# Patient Record
Sex: Male | Born: 1995
Health system: Southern US, Community
[De-identification: ages and names within clinical notes are randomized; demographics above are authoritative.]

## PROBLEM LIST (undated history)

## (undated) HISTORY — PX: TOOTH EXTRACTION: SUR596

---

## 2015-04-10 ENCOUNTER — Emergency Department
Admission: EM | Admit: 2015-04-10 | Discharge: 2015-04-11 | Disposition: A | Discharge status: Home / Self Care | Payer: BLUE CROSS/BLUE SHIELD | Attending: Emergency Medicine | Admitting: Emergency Medicine

## 2015-04-10 ENCOUNTER — Encounter: Payer: Self-pay | Admitting: Emergency Medicine

## 2015-04-10 DIAGNOSIS — X58XXXA Exposure to other specified factors, initial encounter: Secondary | ICD-10-CM | POA: Insufficient documentation

## 2015-04-10 DIAGNOSIS — Y9389 Activity, other specified: Secondary | ICD-10-CM | POA: Insufficient documentation

## 2015-04-10 DIAGNOSIS — R42 Dizziness and giddiness: Secondary | ICD-10-CM | POA: Insufficient documentation

## 2015-04-10 DIAGNOSIS — S025XXA Fracture of tooth (traumatic), initial encounter for closed fracture: Secondary | ICD-10-CM | POA: Diagnosis not present

## 2015-04-10 DIAGNOSIS — Y998 Other external cause status: Secondary | ICD-10-CM | POA: Diagnosis not present

## 2015-04-10 DIAGNOSIS — H5509 Other forms of nystagmus: Secondary | ICD-10-CM | POA: Insufficient documentation

## 2015-04-10 DIAGNOSIS — Y92000 Kitchen of unspecified non-institutional (private) residence as  the place of occurrence of the external cause: Secondary | ICD-10-CM | POA: Diagnosis not present

## 2015-04-10 DIAGNOSIS — S0990XA Unspecified injury of head, initial encounter: Secondary | ICD-10-CM | POA: Diagnosis not present

## 2015-04-10 DIAGNOSIS — R55 Syncope and collapse: Secondary | ICD-10-CM

## 2015-04-10 DIAGNOSIS — S025XXB Fracture of tooth (traumatic), initial encounter for open fracture: Secondary | ICD-10-CM

## 2015-04-10 LAB — CBC WITH DIFFERENTIAL/PLATELET
BASOS ABS: 0 10*3/uL (ref 0–0.1)
Basophils Relative: 0 %
Eosinophils Absolute: 0.2 10*3/uL (ref 0–0.7)
Eosinophils Relative: 2 %
HEMATOCRIT: 43.3 % (ref 40.0–52.0)
HEMOGLOBIN: 14.3 g/dL (ref 13.0–18.0)
LYMPHS PCT: 33 %
Lymphs Abs: 3.1 10*3/uL (ref 1.0–3.6)
MCH: 29.9 pg (ref 26.0–34.0)
MCHC: 32.9 g/dL (ref 32.0–36.0)
MCV: 90.8 fL (ref 80.0–100.0)
Monocytes Absolute: 0.7 10*3/uL (ref 0.2–1.0)
Monocytes Relative: 8 %
NEUTROS ABS: 5.3 10*3/uL (ref 1.4–6.5)
NEUTROS PCT: 57 %
PLATELETS: 272 10*3/uL (ref 150–440)
RBC: 4.77 MIL/uL (ref 4.40–5.90)
RDW: 13 % (ref 11.5–14.5)
WBC: 9.4 10*3/uL (ref 3.8–10.6)

## 2015-04-10 LAB — BASIC METABOLIC PANEL
ANION GAP: 7 (ref 5–15)
BUN: 14 mg/dL (ref 6–20)
CHLORIDE: 103 mmol/L (ref 101–111)
CO2: 30 mmol/L (ref 22–32)
Calcium: 8.9 mg/dL (ref 8.9–10.3)
Creatinine, Ser: 0.85 mg/dL (ref 0.61–1.24)
GFR calc Af Amer: >60 mL/min (ref >60)
Glucose, Bld: 97 mg/dL (ref 65–99)
POTASSIUM: 3.4 mmol/L — AB (ref 3.5–5.1)
SODIUM: 140 mmol/L (ref 135–145)

## 2015-04-10 LAB — GLUCOSE, CAPILLARY: Glucose-Capillary: 87 mg/dL (ref 65–99)

## 2015-04-10 NOTE — ED Notes (Signed)
MD at bedside. 

## 2015-04-10 NOTE — ED Notes (Signed)
Pt presents to ED after he had a syncopal episode just prior to arrival while at home with his parents. Pt denies recent illness. Pt states he got up from a sitting position to get a drink of water because his "head was feeling tingly".  Pt states he "passed out" and fell on the floor in the kitchen. LOC lasted <1 minute; pt broke a top tooth during his fall. Pt c/o lightheadedness and tingling to his extremities at this time. Reports experiencing occasional dizziness several times the past year but has not been evaluated for the same. Denies any injury or pain at this time.

## 2015-04-10 NOTE — ED Notes (Signed)
Pt reports he no longer has tingling to his extremities. Pt is alert and calm. Talkative with his father at the bedside. Pt states just prior to his syncopal episode he did have a slight headache. Denies currently. Only pt reports having is from his mouth where he broke his tooth. Denies any new symptoms.

## 2015-04-11 ENCOUNTER — Emergency Department: Payer: BLUE CROSS/BLUE SHIELD

## 2015-04-11 LAB — TROPONIN I: Troponin I: <0.03 ng/mL (ref <0.031)

## 2015-04-11 MED ORDER — SODIUM CHLORIDE 0.9 % IV BOLUS (SEPSIS)
1000.0000 mL | Freq: Once | INTRAVENOUS | Status: AC
  Administered 2015-04-11: 1000 mL via INTRAVENOUS

## 2015-04-11 MED ORDER — OXYCODONE-ACETAMINOPHEN 5-325 MG PO TABS
1.0000 | ORAL_TABLET | Freq: Once | ORAL | Status: AC
Start: 2015-04-11 — End: 2015-04-11
  Administered 2015-04-11: 1 via ORAL
  Filled 2015-04-11: qty 1

## 2015-04-11 NOTE — Discharge Instructions (Signed)

## 2015-04-11 NOTE — ED Provider Notes (Signed)
[facility] Emergency Department Provider Note  ____________________________________________  Time seen: Approximately 0003 AM  I have reviewed the triage vital signs and the nursing notes.   HISTORY  Chief Complaint Loss of Consciousness    HPI Jason Brewer is a 19 y.o. male who comes into the hospital today after a syncopal episode. The patient reports that he got up to get a glass of water and he started to feel lightheaded. He reports a Ventolin further countertop and passed out. The patient's father reports that he was out for maybe 15 seconds because after he heard the fall the patient was waking up when he arrived in the kitchen. The patient denied any chest pain or shortness of breath prior to the father did have a slight headache. He reports that he had a cough 2 weeks ago but he had been doing well and eating and drinking like normal. According to the patient's that he had been more sleepy because he recently finished finals. The patient has had some episodes of dizziness in the past but has never passed out like this before.The patient has some 7 out of 10 pain in his mouth where he broke his tooth after the fall.   History reviewed. No pertinent past medical history.  There are no active problems to display for this patient.   Past Surgical History  Procedure Laterality Date  . Tooth extraction      No current outpatient prescriptions on file.  Allergies Review of patient's allergies indicates no known allergies.  No family history on file.  Social History Social History  Substance Use Topics  . Smoking status: Never Smoker   . Smokeless tobacco: None  . Alcohol Use: Yes    Review of Systems Constitutional: No fever/chills Eyes: No visual changes. ENT: tooth pain. Cardiovascular: Denies chest pain. Respiratory: Denies shortness of breath. Gastrointestinal: No abdominal pain.  No nausea, no vomiting.  No diarrhea.  No  constipation. Genitourinary: Negative for dysuria. Musculoskeletal: Negative for back pain. Skin: Negative for rash. Neurological: headache.  10-point ROS otherwise negative.  ____________________________________________   PHYSICAL EXAM:  VITAL SIGNS: ED Triage Vitals  Enc Vitals Group     BP 04/10/15 2258 114/57 mmHg     Pulse Rate 04/10/15 2258 65     Resp 04/10/15 2258 18     Temp 04/10/15 2258 98.6 F (37 C)     Temp Source 04/10/15 2258 Oral     SpO2 04/10/15 2258 98 %     Weight 04/10/15 2258 125 lb (56.7 kg)     Height 04/10/15 2258  (1.803 m)     Head Cir --      Peak Flow --      Pain Score 04/10/15 2305 0     Pain Loc --      Pain Edu? --      Excl. in GC? --     Constitutional: Alert and oriented. Well appearing and in no acute distress. Eyes: Conjunctivae are normal. PERRL. EOMI. Head: Atraumatic. Nose: No congestion/rhinnorhea. Mouth/Throat: Mucous membranes are moist.  Oropharynx non-erythematous. Cardiovascular: Normal rate, regular rhythm. Grossly normal heart sounds.  Good peripheral circulation. Respiratory: Normal respiratory effort.  No retractions. Lungs CTAB. Gastrointestinal: Soft and nontender. No distention. Positive bowel sounds Musculoskeletal: No lower extremity tenderness nor edema.  Neurologic:  Normal speech and language. Cranial nerves II through XII are grossly intact, patient does have some mild horizontal nystagmus Skin:  Skin is warm, dry and intact.  Psychiatric: Mood and affect are normal.   ____________________________________________   LABS (all labs ordered are listed, but only abnormal results are displayed)  Labs Reviewed  BASIC METABOLIC PANEL - Abnormal; Notable for the following:    Potassium 3.4 (*)    All other components within normal limits  CBC WITH DIFFERENTIAL/PLATELET  GLUCOSE, CAPILLARY  TROPONIN I  TROPONIN I  CBG MONITORING, ED   ____________________________________________  EKG  ED ECG  REPORT I, Rebecka ApleyWebster,  Anahi Belmar P, the attending physician, personally viewed and interpreted this ECG.   Date: 04/10/2015  EKG Time: 2304  Rate: 65  Rhythm: normal sinus rhythm  Axis: normal  Intervals:none  ST&T Change: none  ____________________________________________  RADIOLOGY  CT head: No acute intracranial pathology ____________________________________________   PROCEDURES  Procedure(s) performed: None  Critical Care performed: No  ____________________________________________   INITIAL IMPRESSION / ASSESSMENT AND PLAN / ED COURSE  Pertinent labs & imaging results that were available during my care of the patient were reviewed by me and considered in my medical decision making (see chart for details).  This is a 19 year old male who comes in today with a syncopal event. The patient had no symptoms that preceded the syncopal event. The patient's orthostatics were unremarkable and he did not feel dizzy when he was standing. The patient did have blood work that was unremarkable as well as CT that did not show any cause for syncope. The patient is sitting up well and he reports that he has no complaints at this time. I feel it is appropriate to discharge the patient home and have her follow up with his primary care physician for further evaluation of his syncope. ____________________________________________   FINAL CLINICAL IMPRESSION(S) / ED DIAGNOSES  Final diagnoses:  Syncope, unspecified syncope type  Traumatic fracture of tooth, open, initial encounter      Rebecka ApleyAllison P Lior Hoen, MD 04/11/15 0410

## 2017-01-28 DIAGNOSIS — F419 Anxiety disorder, unspecified: Secondary | ICD-10-CM | POA: Diagnosis not present

## 2017-05-14 IMAGING — CT CT HEAD W/O CM
2 series · 14 of 30 positions shown, 16 images · non-contrast
Comparison: None.

CLINICAL DATA: 19-year-old male with syncope and fall

EXAM:
CT HEAD WITHOUT CONTRAST
TECHNIQUE: Contiguous axial images were obtained from the base of the skull
through the vertex without intravenous contrast.

[Series 2: head wo · axial · 0.47mm/px · z∈[-154,-54]mm · 6 of 30 slices shown, 8 images]
[im 5/30  brain]
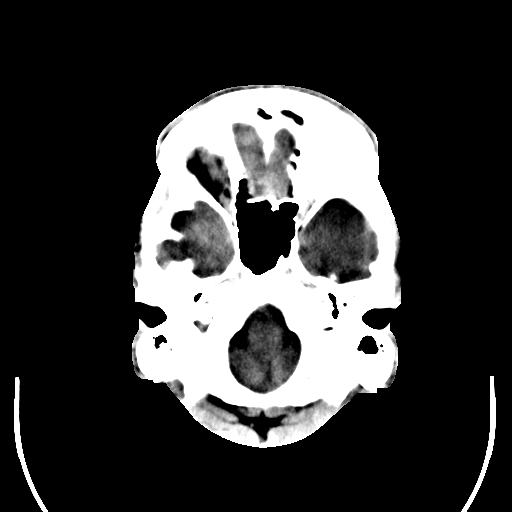
[im 5/30  bone]
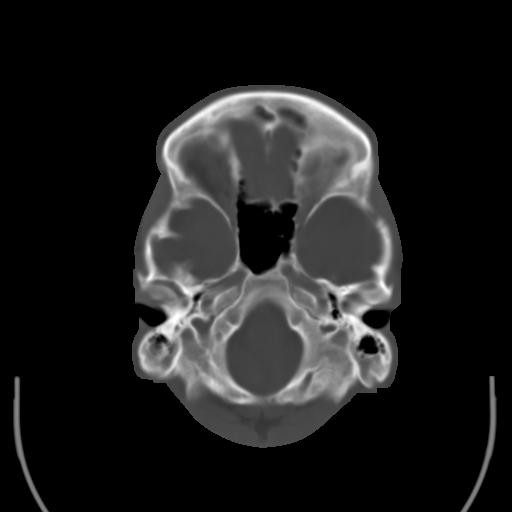
[im 9/30  brain]
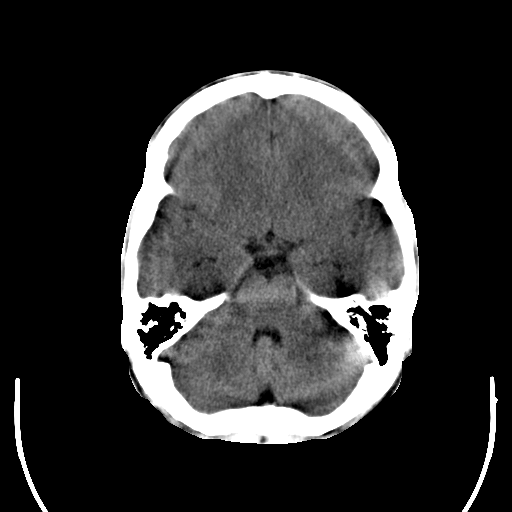
[im 13/30  brain]
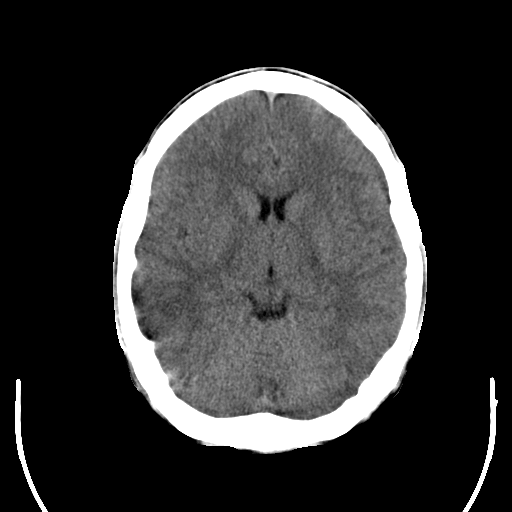
[im 17/30  brain]
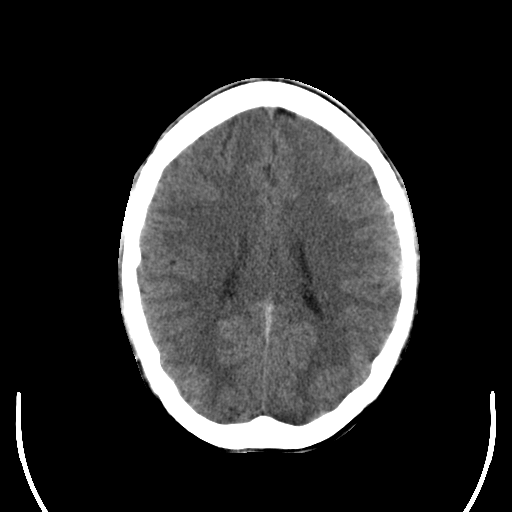
[im 21/30  brain]
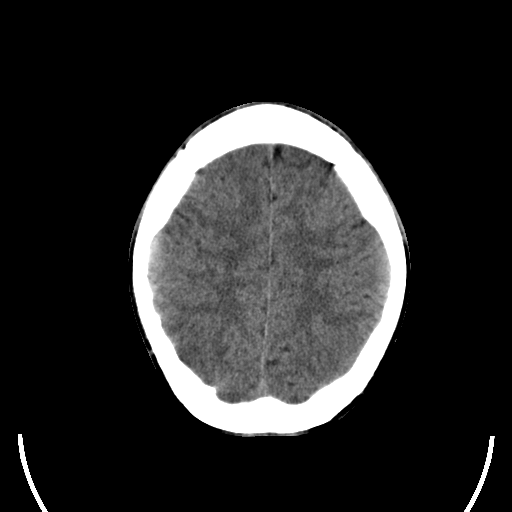
[im 21/30  bone]
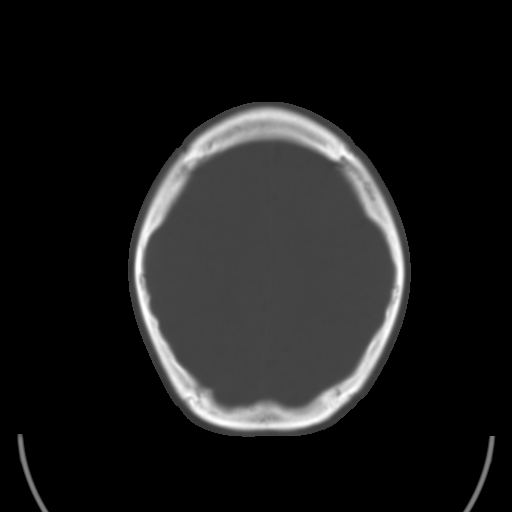
[im 25/30  brain]
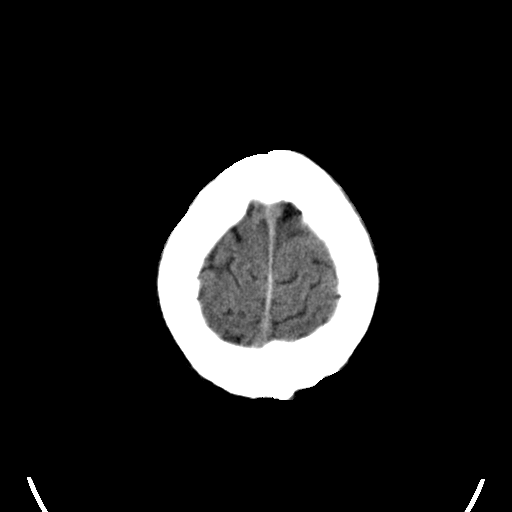

[Series 3: head bone · axial · 0.47mm/px · z∈[-171,-35]mm · 8 of 84 slices shown]
[im 8/84  bone]
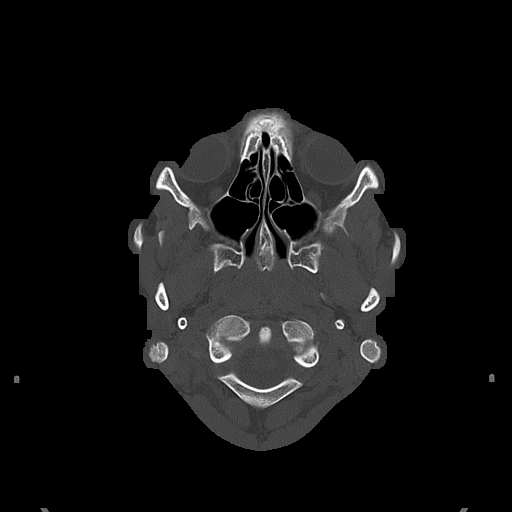
[im 16/84  bone]
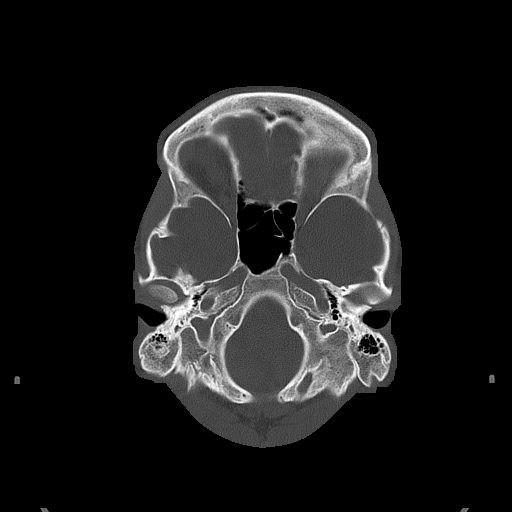
[im 28/84  bone]
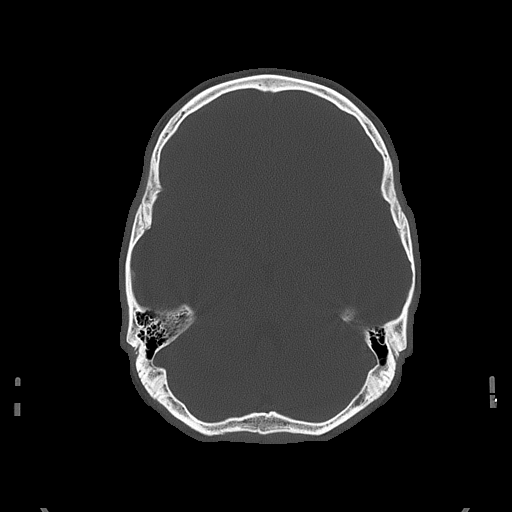
[im 36/84  bone]
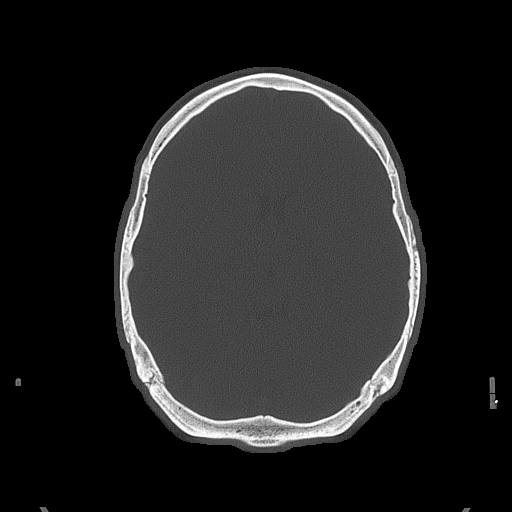
[im 48/84  bone]
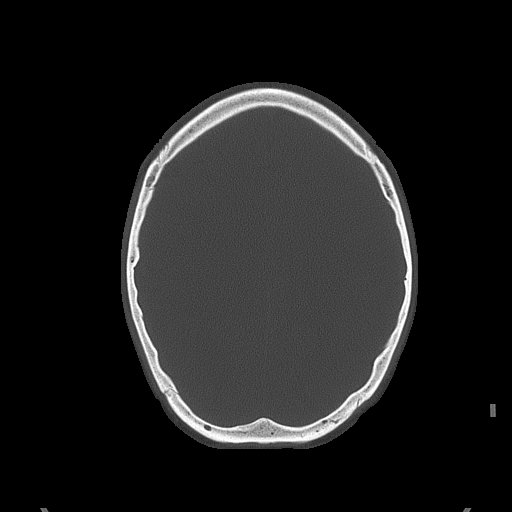
[im 56/84  bone]
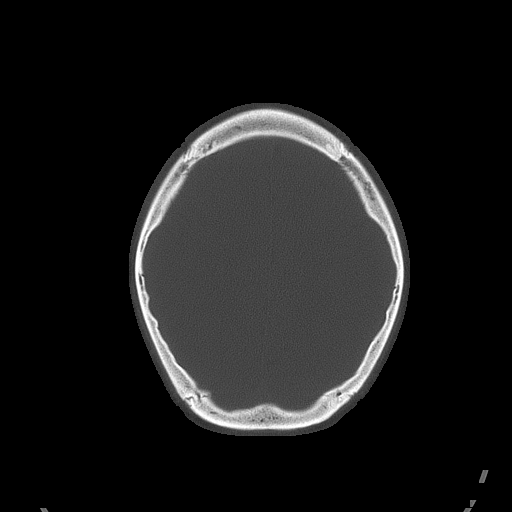
[im 68/84  bone]
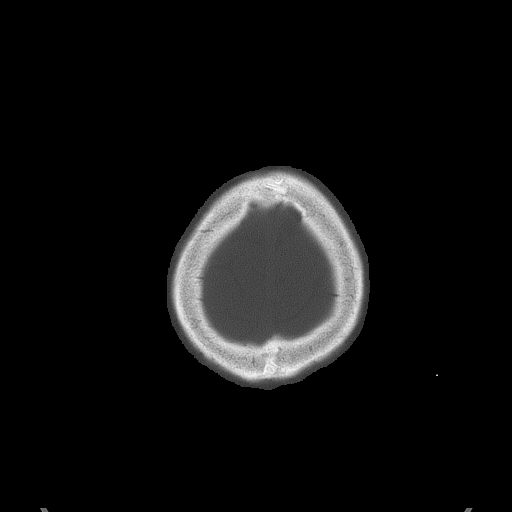
[im 76/84  bone]
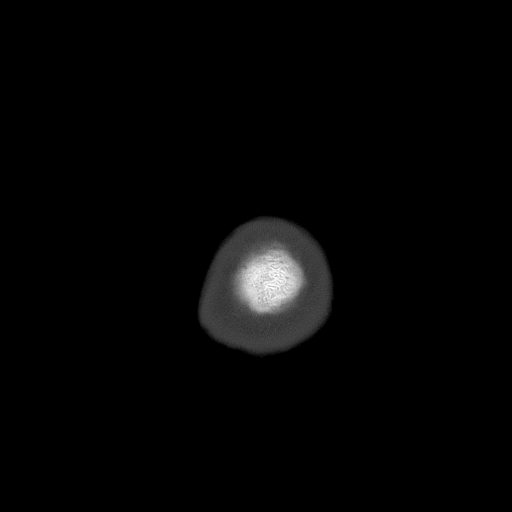

[14 of 30 positions shown; findings below may reference images not displayed]

FINDINGS: The ventricles and the sulci are appropriate in size for the
patient's age. There is no intracranial hemorrhage. No midline shift
or mass effect identified. The gray-white matter differentiation is
preserved.

The visualized paranasal sinuses and mastoid air cells are well
aerated. The calvarium is intact.
IMPRESSION: No acute intracranial pathology.

## 2018-01-27 DIAGNOSIS — F419 Anxiety disorder, unspecified: Secondary | ICD-10-CM | POA: Diagnosis not present

## 2018-01-27 DIAGNOSIS — F909 Attention-deficit hyperactivity disorder, unspecified type: Secondary | ICD-10-CM | POA: Diagnosis not present

## 2018-02-17 DIAGNOSIS — F909 Attention-deficit hyperactivity disorder, unspecified type: Secondary | ICD-10-CM | POA: Diagnosis not present

## 2018-02-17 DIAGNOSIS — F419 Anxiety disorder, unspecified: Secondary | ICD-10-CM | POA: Diagnosis not present

## 2018-03-05 DIAGNOSIS — F419 Anxiety disorder, unspecified: Secondary | ICD-10-CM | POA: Diagnosis not present

## 2018-03-05 DIAGNOSIS — F909 Attention-deficit hyperactivity disorder, unspecified type: Secondary | ICD-10-CM | POA: Diagnosis not present

## 2018-03-24 ENCOUNTER — Encounter: Payer: Self-pay | Admitting: Internal Medicine

## 2018-03-24 ENCOUNTER — Ambulatory Visit (INDEPENDENT_AMBULATORY_CARE_PROVIDER_SITE_OTHER): Payer: BLUE CROSS/BLUE SHIELD | Admitting: Internal Medicine

## 2018-03-24 VITALS — BP 106/68 | HR 76 | Temp 98.0°F | Wt 122.0 lb

## 2018-03-24 DIAGNOSIS — F902 Attention-deficit hyperactivity disorder, combined type: Secondary | ICD-10-CM | POA: Insufficient documentation

## 2018-03-24 DIAGNOSIS — F411 Generalized anxiety disorder: Secondary | ICD-10-CM | POA: Insufficient documentation

## 2018-03-24 MED ORDER — AMPHETAMINE-DEXTROAMPHET ER 10 MG PO CP24: 10.0000 mg | ORAL_CAPSULE | Freq: Every day | ORAL | 0 refills | Status: DC

## 2018-03-24 NOTE — Assessment & Plan Note (Signed)
Stable off meds He is not interested in starting medication for anxiety at this time Will monitor

## 2018-03-24 NOTE — Patient Instructions (Signed)

## 2018-03-24 NOTE — Assessment & Plan Note (Signed)
Psychologist note reviewed Will start Adderall XR 10 mg daily Advised him to update me in 3-4 weeks and let me know how he is doing

## 2018-03-24 NOTE — Progress Notes (Signed)
HPI  Jason Brewer presents to the clinic today for her annual exam. He is transferring care from [facility].  GAD: Triggered by stress at school. Took medication for a short period of time but did not feel like it was effective. Still feels anxious at times but denies panic attacks, depression, SI/HI.  ADHD: He has trouble with focus and motivation. He recently saw a psychologist that diagnosed him with ADHD. He is here to initiate medication management.  Flu: never Tetanus: 2009 Dentist: as needed  No past medical history on file.  No current outpatient medications on file.   No current facility-administered medications for this visit.     Allergies  Allergen Reactions  . Bee Venom Swelling    No family history on file.  Social History   Socioeconomic History  . Marital status: Single    Spouse name: Not on file  . Number of children: Not on file  . Years of education: Not on file  . Highest education level: Not on file  Occupational History  . Not on file  Social Needs  . Financial resource strain: Not on file  . Food insecurity:    Worry: Not on file    Inability: Not on file  . Transportation needs:    Medical: Not on file    Non-medical: Not on file  Tobacco Use  . Smoking status: Never Smoker  . Smokeless tobacco: Never Used  Substance and Sexual Activity  . Alcohol use: Not Currently  . Drug use: No  . Sexual activity: Not on file  Lifestyle  . Physical activity:    Days per week: Not on file    Minutes per session: Not on file  . Stress: Not on file  Relationships  . Social connections:    Talks on phone: Not on file    Gets together: Not on file    Attends religious service: Not on file    Active member of club or organization: Not on file    Attends meetings of clubs or organizations: Not on file    Relationship status: Not on file  . Intimate partner violence:    Fear of current or ex partner: Not on file    Emotionally abused: Not on file     Physically abused: Not on file    Forced sexual activity: Not on file  Other Topics Concern  . Not on file  Social History Narrative  . Not on file    ROS:  Constitutional: Denies fever, malaise, fatigue, headache or abrupt weight changes.  HEENT: Denies eye pain, eye redness, ear pain, ringing in the ears, wax buildup, runny nose, nasal congestion, bloody nose, or sore throat. Respiratory: Denies difficulty breathing, shortness of breath, cough or sputum production.   Cardiovascular: Denies chest pain, chest tightness, palpitations or swelling in the hands or feet.  Gastrointestinal: Denies abdominal pain, bloating, constipation, diarrhea or blood in the stool.  GU: Denies frequency, urgency, pain with urination, blood in urine, odor or discharge. Musculoskeletal: Denies decrease in range of motion, difficulty with gait, muscle pain or joint pain and swelling.  Skin: Denies redness, rashes, lesions or ulcercations.  Neurological: Jason Brewer reports trouble focusing, trouble with motivation. Denies dizziness, difficulty with memory, difficulty with speech or problems with balance and coordination.  Psych: Jason Brewer reports anxiety. Denies depression, SI/HI.  No other specific complaints in a complete review of systems (except as listed in HPI above).  PE:  BP 106/68   Pulse 76  Temp 98 F (36.7 C) (Oral)   Wt 122 lb (55.3 kg)   SpO2 98%   BMI 17.02 kg/m   Wt Readings from Last 3 Encounters:  03/24/18 122 lb (55.3 kg)  04/10/15 125 lb (56.7 kg) (7 %, Z= -1.48)*   * Growth percentiles are based on CDC (Boys, 2-20 Years) data.    General: Appears his stated age, underweight in NAD. Skin: Dry and intact. Cardiovascular: Normal rate and rhythm. S1,S2 noted.  No murmur, rubs or gallops noted.  Pulmonary/Chest: Normal effort and positive vesicular breath sounds. No respiratory distress. No wheezes, rales or ronchi noted.  Neurological: Alert and oriented.  Psychiatric: Anxious appearing.  Judgment and thought content normal.    BMET    Component Value Date/Time   NA 140 04/10/2015 2309   K 3.4 (L) 04/10/2015 2309   CL 103 04/10/2015 2309   CO2 30 04/10/2015 2309   GLUCOSE 97 04/10/2015 2309   BUN 14 04/10/2015 2309   CREATININE 0.85 04/10/2015 2309   CALCIUM 8.9 04/10/2015 2309   GFRNONAA >60 04/10/2015 2309   GFRAA >60 04/10/2015 2309    Lipid Panel  No results found for: CHOL, TRIG, HDL, CHOLHDL, VLDL, LDLCALC  CBC    Component Value Date/Time   WBC 9.4 04/10/2015 2309   RBC 4.77 04/10/2015 2309   HGB 14.3 04/10/2015 2309   HCT 43.3 04/10/2015 2309   PLT 272 04/10/2015 2309   MCV 90.8 04/10/2015 2309   MCH 29.9 04/10/2015 2309   MCHC 32.9 04/10/2015 2309   RDW 13.0 04/10/2015 2309   LYMPHSABS 3.1 04/10/2015 2309   MONOABS 0.7 04/10/2015 2309   EOSABS 0.2 04/10/2015 2309   BASOSABS 0.0 04/10/2015 2309    Hgb A1C No results found for: HGBA1C   Assessment and Plan:

## 2018-03-27 ENCOUNTER — Encounter: Payer: Self-pay | Admitting: Internal Medicine

## 2018-04-02 DIAGNOSIS — F909 Attention-deficit hyperactivity disorder, unspecified type: Secondary | ICD-10-CM | POA: Diagnosis not present

## 2018-04-02 DIAGNOSIS — F419 Anxiety disorder, unspecified: Secondary | ICD-10-CM | POA: Diagnosis not present

## 2018-04-06 MED ORDER — AMPHETAMINE-DEXTROAMPHET ER 10 MG PO CP24: 20.0000 mg | ORAL_CAPSULE | Freq: Every day | ORAL | 0 refills | Status: DC

## 2018-04-10 ENCOUNTER — Other Ambulatory Visit: Payer: Self-pay | Admitting: Internal Medicine

## 2018-04-10 NOTE — Telephone Encounter (Signed)
Name of Medication: Adderall XR 10 mg Name of Pharmacy: [facility] Last ReeseFill or Written Date and Quantity: # 60 on 04/06/18 Last Office Visit and Type:  Next Office Visit and Type:  Last Controlled Substance Agreement Date:  Last UDS:  Adderall XR 10 mg already sent electronically to [facility].

## 2018-04-14 ENCOUNTER — Encounter: Payer: Self-pay | Admitting: Internal Medicine

## 2018-04-21 MED ORDER — AMPHETAMINE-DEXTROAMPHETAMINE 10 MG PO TABS: 10.0000 mg | ORAL_TABLET | Freq: Every day | ORAL | 0 refills | Status: DC

## 2018-04-21 MED ORDER — AMPHETAMINE-DEXTROAMPHET ER 10 MG PO CP24: 10.0000 mg | ORAL_CAPSULE | ORAL | 0 refills | Status: DC

## 2018-04-21 MED ORDER — AMPHETAMINE-DEXTROAMPHETAMINE 10 MG PO TABS
10.0000 mg | ORAL_TABLET | Freq: Two times a day (BID) | ORAL | 0 refills | Status: DC
Start: 2018-04-21 — End: 2018-04-21

## 2018-04-21 NOTE — Telephone Encounter (Signed)
Quantity limit for XR and insurance will not cover, Rx changed to immediate release BID and pt notified via mychart

## 2018-04-21 NOTE — Addendum Note (Signed)
Addended by: Roena MaladyEVONTENNO, Brenten Janney Y on: 04/21/2018 11:50 AM   Modules accepted: Orders

## 2018-05-16 ENCOUNTER — Other Ambulatory Visit: Payer: Self-pay | Admitting: Internal Medicine

## 2018-05-16 DIAGNOSIS — Z79899 Other long term (current) drug therapy: Secondary | ICD-10-CM

## 2018-05-19 ENCOUNTER — Encounter: Payer: Self-pay | Admitting: Internal Medicine

## 2018-05-20 MED ORDER — AMPHETAMINE-DEXTROAMPHETAMINE 10 MG PO TABS: 10.0000 mg | ORAL_TABLET | Freq: Every day | ORAL | 0 refills | Status: DC

## 2018-05-20 MED ORDER — AMPHETAMINE-DEXTROAMPHET ER 10 MG PO CP24: 10.0000 mg | ORAL_CAPSULE | ORAL | 0 refills | Status: DC

## 2018-05-20 NOTE — Telephone Encounter (Signed)
Last filled 04/21/2018... please advise due for tomorrow  CSA placed in the front office for pt and UDS ordered

## 2018-06-18 ENCOUNTER — Other Ambulatory Visit: Payer: Self-pay | Admitting: Internal Medicine

## 2018-06-19 MED ORDER — AMPHETAMINE-DEXTROAMPHET ER 10 MG PO CP24: 10.0000 mg | ORAL_CAPSULE | ORAL | 0 refills | Status: DC

## 2018-06-19 MED ORDER — AMPHETAMINE-DEXTROAMPHETAMINE 10 MG PO TABS: 10.0000 mg | ORAL_TABLET | Freq: Every day | ORAL | 0 refills | Status: DC

## 2018-06-19 NOTE — Telephone Encounter (Signed)
Last filled 05/20/2018... please advise 

## 2018-07-20 ENCOUNTER — Other Ambulatory Visit: Payer: Self-pay | Admitting: Internal Medicine

## 2018-07-20 NOTE — Telephone Encounter (Signed)
Last filled 06/19/2018... please advise

## 2018-07-21 MED ORDER — AMPHETAMINE-DEXTROAMPHETAMINE 10 MG PO TABS: 10.0000 mg | ORAL_TABLET | Freq: Every day | ORAL | 0 refills | Status: DC

## 2018-07-21 MED ORDER — AMPHETAMINE-DEXTROAMPHET ER 10 MG PO CP24: 10.0000 mg | ORAL_CAPSULE | ORAL | 0 refills | Status: DC

## 2018-07-26 ENCOUNTER — Encounter: Payer: Self-pay | Admitting: Internal Medicine

## 2018-08-22 ENCOUNTER — Other Ambulatory Visit: Payer: Self-pay | Admitting: Internal Medicine

## 2018-08-24 MED ORDER — AMPHETAMINE-DEXTROAMPHETAMINE 10 MG PO TABS: 10.0000 mg | ORAL_TABLET | Freq: Every day | ORAL | 0 refills | Status: DC

## 2018-08-24 MED ORDER — AMPHETAMINE-DEXTROAMPHET ER 10 MG PO CP24: 10.0000 mg | ORAL_CAPSULE | ORAL | 0 refills | Status: DC

## 2018-08-24 NOTE — Telephone Encounter (Signed)
Last filled 07/21/18.... please advise 

## 2018-09-23 ENCOUNTER — Other Ambulatory Visit: Payer: Self-pay | Admitting: Internal Medicine

## 2018-09-23 NOTE — Telephone Encounter (Signed)
Last filled 08/24/2018... please advise 

## 2018-09-24 MED ORDER — AMPHETAMINE-DEXTROAMPHET ER 10 MG PO CP24: 10.0000 mg | ORAL_CAPSULE | ORAL | 0 refills | Status: DC

## 2018-09-24 MED ORDER — AMPHETAMINE-DEXTROAMPHETAMINE 10 MG PO TABS: 10.0000 mg | ORAL_TABLET | Freq: Every day | ORAL | 0 refills | Status: DC

## 2018-10-27 ENCOUNTER — Other Ambulatory Visit: Payer: Self-pay | Admitting: Internal Medicine

## 2018-10-27 NOTE — Telephone Encounter (Signed)
Last office visit 03/24/2018 for GAD.  Last refilled 09/24/2018 for #30 with no refills.  No future appointments.

## 2018-10-28 MED ORDER — AMPHETAMINE-DEXTROAMPHET ER 10 MG PO CP24: 10.0000 mg | ORAL_CAPSULE | ORAL | 0 refills | Status: DC

## 2018-10-28 MED ORDER — AMPHETAMINE-DEXTROAMPHETAMINE 10 MG PO TABS: 10.0000 mg | ORAL_TABLET | Freq: Every day | ORAL | 0 refills | Status: DC

## 2018-11-09 ENCOUNTER — Encounter: Payer: Self-pay | Admitting: Internal Medicine

## 2018-11-10 ENCOUNTER — Telehealth: Payer: Self-pay | Admitting: Internal Medicine

## 2018-11-10 NOTE — Telephone Encounter (Signed)
Appointment 7/23

## 2018-11-10 NOTE — Telephone Encounter (Signed)
Called patient to schedule appt per mychart request. Lvm asking him to call office. See request below.  Appointment Request From: Tessie Fass    With Provider: Webb Silversmith, NP [facility] [place]    Preferred Date Range: 11/11/2018 - 11/13/2018    Preferred Times: Any Time    Reason for visit: Request an Appointment    Comments:  I have bumps or sores that have been here for several months that Id like to have looked at.

## 2018-11-12 ENCOUNTER — Ambulatory Visit (INDEPENDENT_AMBULATORY_CARE_PROVIDER_SITE_OTHER): Payer: BC Managed Care – PPO | Admitting: Internal Medicine

## 2018-11-12 ENCOUNTER — Other Ambulatory Visit: Payer: Self-pay

## 2018-11-12 ENCOUNTER — Encounter: Payer: Self-pay | Admitting: Internal Medicine

## 2018-11-12 VITALS — BP 104/66 | HR 102 | Temp 98.4°F | Wt 114.0 lb

## 2018-11-12 DIAGNOSIS — L989 Disorder of the skin and subcutaneous tissue, unspecified: Secondary | ICD-10-CM

## 2018-11-12 MED ORDER — TRIAMCINOLONE ACETONIDE 0.1 % EX CREA: 1.0000 "application " | TOPICAL_CREAM | Freq: Two times a day (BID) | CUTANEOUS | 0 refills | Status: AC

## 2018-11-12 NOTE — Progress Notes (Signed)
Subjective:    Patient ID: Jason Brewer, male    DOB: [birth year], 23 y.o.   MRN: [id number]  HPI  Patient presents to the clinic today for complaints of bumps/sores scattered on his extremities that have been present for several months.  He states they itch but then the itching stops without intervention.  He denies pain and changes in soaps, lotions, and detergents. He has not tried any OTC treatments.  He has no family history of skin lesions.  Review of Systems  No past medical history on file.  Current Outpatient Medications  Medication Sig Dispense Refill  . amphetamine-dextroamphetamine (ADDERALL XR) 10 MG 24 hr capsule Take 1 capsule (10 mg total) by mouth every morning. 30 capsule 0  . amphetamine-dextroamphetamine (ADDERALL) 10 MG tablet Take 1 tablet (10 mg total) by mouth daily at 2 PM. 30 tablet 0   No current facility-administered medications for this visit.     Allergies  Allergen Reactions  . Bee Venom Swelling    No family history on file.  Social History   Socioeconomic History  . Marital status: Single    Spouse name: Not on file  . Number of children: Not on file  . Years of education: Not on file  . Highest education level: Not on file  Occupational History  . Not on file  Social Needs  . Financial resource strain: Not on file  . Food insecurity    Worry: Not on file    Inability: Not on file  . Transportation needs    Medical: Not on file    Non-medical: Not on file  Tobacco Use  . Smoking status: Never Smoker  . Smokeless tobacco: Never Used  Substance and Sexual Activity  . Alcohol use: Not Currently  . Drug use: No  . Sexual activity: Not on file  Lifestyle  . Physical activity    Days per week: Not on file    Minutes per session: Not on file  . Stress: Not on file  Relationships  . Social Herbalist on phone: Not on file    Gets together: Not on file    Attends religious service: Not on file    Active member of club  or organization: Not on file    Attends meetings of clubs or organizations: Not on file    Relationship status: Not on file  . Intimate partner violence    Fear of current or ex partner: Not on file    Emotionally abused: Not on file    Physically abused: Not on file    Forced sexual activity: Not on file  Other Topics Concern  . Not on file  Social History Narrative  . Not on file     Constitutional: Denies fever, malaise, fatigue, headache or abrupt weight changes.  Skin: Complains of scattered bumps to BUE, BLE for several months. Denies itching presently      Objective:   Physical Exam  BP 104/66   Pulse (!) 102   Temp 98.4 F (36.9 C) (Temporal)   Wt 114 lb (51.7 kg)   SpO2 98%   BMI 15.90 kg/m    Wt Readings from Last 3 Encounters:  03/24/18 122 lb (55.3 kg)  04/10/15 125 lb (56.7 kg) (7 %, Z= -1.48)*   * Growth percentiles are based on CDC (Boys, 2-20 Years) data.    General: Appears his stated age, well developed, well nourished in NAD. Skin: Scattered circular, hyperpigmented, scaly lesions  noted to bilateral inner thighs, Left posterior forearm and right posterior elbow.    BMET    Component Value Date/Time   NA 140 04/10/2015 2309   K 3.4 (L) 04/10/2015 2309   CL 103 04/10/2015 2309   CO2 30 04/10/2015 2309   GLUCOSE 97 04/10/2015 2309   BUN 14 04/10/2015 2309   CREATININE 0.85 04/10/2015 2309   CALCIUM 8.9 04/10/2015 2309   GFRNONAA >60 04/10/2015 2309   GFRAA >60 04/10/2015 2309    Lipid Panel  No results found for: CHOL, TRIG, HDL, CHOLHDL, VLDL, LDLCALC  CBC    Component Value Date/Time   WBC 9.4 04/10/2015 2309   RBC 4.77 04/10/2015 2309   HGB 14.3 04/10/2015 2309   HCT 43.3 04/10/2015 2309   PLT 272 04/10/2015 2309   MCV 90.8 04/10/2015 2309   MCH 29.9 04/10/2015 2309   MCHC 32.9 04/10/2015 2309   RDW 13.0 04/10/2015 2309   LYMPHSABS 3.1 04/10/2015 2309   MONOABS 0.7 04/10/2015 2309   EOSABS 0.2 04/10/2015 2309   BASOSABS 0.0  04/10/2015 2309    Hgb A1C No results found for: HGBA1C          Assessment & Plan:    Scaly lesions to BUE and BLE:  ? Etiology Rx for Triamcinolone 1% cream to be used twice daily sent to patients pharmacy.  Return precautions discussed.  Nicki Reaperegina Makira Holleman, NP

## 2018-11-12 NOTE — Patient Instructions (Signed)
Rash, Adult  A rash is a change in the color of your skin. A rash can also change the way your skin feels. There are many different conditions and factors that can cause a rash. Follow these instructions at home: The goal of treatment is to stop the itching and keep the rash from spreading. Watch for any changes in your symptoms. Let your doctor know about them. Follow these instructions to help with your condition: Medicine Take or apply over-the-counter and prescription medicines only as told by your doctor. These may include medicines:  To treat red or swollen skin (corticosteroid creams).  To treat itching.  To treat an allergy (oral antihistamines).  To treat very bad symptoms (oral corticosteroids).  Skin care  Put cool cloths (compresses) on the affected areas.  Do not scratch or rub your skin.  Avoid covering the rash. Make sure that the rash is exposed to air as much as possible. Managing itching and discomfort  Avoid hot showers or baths. These can make itching worse. A cold shower may help.  Try taking a bath with: ? Epsom salts. You can get these at your local pharmacy or grocery store. Follow the instructions on the package. ? Baking soda. Pour a small amount into the bath as told by your doctor. ? Colloidal oatmeal. You can get this at your local pharmacy or grocery store. Follow the instructions on the package.  Try putting baking soda paste onto your skin. Stir water into baking soda until it gets like a paste.  Try putting on a lotion that relieves itchiness (calamine lotion).  Keep cool and out of the sun. Sweating and being hot can make itching worse. General instructions   Rest as needed.  Drink enough fluid to keep your pee (urine) pale yellow.  Wear loose-fitting clothing.  Avoid scented soaps, detergents, and perfumes. Use gentle soaps, detergents, perfumes, and other cosmetic products.  Avoid anything that causes your rash. Keep a journal to  help track what causes your rash. Write down: ? What you eat. ? What cosmetic products you use. ? What you drink. ? What you wear. This includes jewelry.  Keep all follow-up visits as told by your doctor. This is important. Contact a doctor if:  You sweat at night.  You lose weight.  You pee (urinate) more than normal.  You pee less than normal, or you notice that your pee is a darker color than normal.  You feel weak.  You throw up (vomit).  Your skin or the whites of your eyes look yellow (jaundice).  Your skin: ? Tingles. ? Is numb.  Your rash: ? Does not go away after a few days. ? Gets worse.  You are: ? More thirsty than normal. ? More tired than normal.  You have: ? New symptoms. ? Pain in your belly (abdomen). ? A fever. ? Watery poop (diarrhea). Get help right away if:  You have a fever and your symptoms suddenly get worse.  You start to feel mixed up (confused).  You have a very bad headache or a stiff neck.  You have very bad joint pains or stiffness.  You have jerky movements that you cannot control (seizure).  Your rash covers all or most of your body. The rash may or may not be painful.  You have blisters that: ? Are on top of the rash. ? Grow larger. ? Grow together. ? Are painful. ? Are inside your nose or mouth.  You have a rash   that: ? Looks like purple pinprick-sized spots all over your body. ? Has a "bull's eye" or looks like a target. ? Is red and painful, causes your skin to peel, and is not from being in the sun too long. Summary  A rash is a change in the color of your skin. A rash can also change the way your skin feels.  The goal of treatment is to stop the itching and keep the rash from spreading.  Take or apply over-the-counter and prescription medicines only as told by your doctor.  Contact a doctor if you have new symptoms or symptoms that get worse.  Keep all follow-up visits as told by your doctor. This is  important. This information is not intended to replace advice given to you by your health care provider. Make sure you discuss any questions you have with your health care provider. Document Released: 09/25/2007 Document Revised: 07/31/2018 Document Reviewed: 11/10/2017 Elsevier Patient Education  2020 [employer]  

## 2018-12-01 ENCOUNTER — Encounter: Payer: Self-pay | Admitting: Internal Medicine

## 2018-12-01 ENCOUNTER — Other Ambulatory Visit: Payer: Self-pay | Admitting: Internal Medicine

## 2018-12-01 DIAGNOSIS — L989 Disorder of the skin and subcutaneous tissue, unspecified: Secondary | ICD-10-CM

## 2018-12-01 MED ORDER — AMPHETAMINE-DEXTROAMPHET ER 10 MG PO CP24: 10.0000 mg | ORAL_CAPSULE | ORAL | 0 refills | Status: DC

## 2018-12-01 MED ORDER — AMPHETAMINE-DEXTROAMPHETAMINE 10 MG PO TABS: 10.0000 mg | ORAL_TABLET | Freq: Every day | ORAL | 0 refills | Status: DC

## 2018-12-01 NOTE — Telephone Encounter (Signed)
Last filled 10/28/2018.Marland KitchenMarland KitchenMarland Kitchen please advise

## 2019-01-21 ENCOUNTER — Other Ambulatory Visit: Payer: Self-pay | Admitting: Internal Medicine

## 2019-01-21 NOTE — Telephone Encounter (Signed)
Last filled 12/01/2018... please advise  

## 2019-01-22 MED ORDER — AMPHETAMINE-DEXTROAMPHETAMINE 10 MG PO TABS: 10.0000 mg | ORAL_TABLET | Freq: Every day | ORAL | 0 refills | Status: DC

## 2019-01-22 MED ORDER — AMPHETAMINE-DEXTROAMPHET ER 10 MG PO CP24: 10.0000 mg | ORAL_CAPSULE | ORAL | 0 refills | Status: DC

## 2019-02-22 ENCOUNTER — Encounter: Payer: Self-pay | Admitting: Internal Medicine

## 2019-03-16 ENCOUNTER — Encounter: Payer: Self-pay | Admitting: Internal Medicine

## 2019-04-06 ENCOUNTER — Other Ambulatory Visit: Payer: Self-pay | Admitting: Internal Medicine

## 2019-04-07 MED ORDER — AMPHETAMINE-DEXTROAMPHETAMINE 10 MG PO TABS: 10.0000 mg | ORAL_TABLET | Freq: Every day | ORAL | 0 refills | Status: DC

## 2019-04-07 MED ORDER — AMPHETAMINE-DEXTROAMPHET ER 10 MG PO CP24: 10.0000 mg | ORAL_CAPSULE | ORAL | 0 refills | Status: DC

## 2019-04-07 NOTE — Telephone Encounter (Signed)
Last filled 01/22/2019--please advise

## 2019-05-25 ENCOUNTER — Other Ambulatory Visit: Payer: Self-pay | Admitting: Internal Medicine

## 2019-05-25 NOTE — Telephone Encounter (Signed)
Last filled 04/07/2019... please advise  

## 2019-05-26 MED ORDER — AMPHETAMINE-DEXTROAMPHETAMINE 10 MG PO TABS: 10.0000 mg | ORAL_TABLET | Freq: Every day | ORAL | 0 refills | Status: DC

## 2019-05-26 MED ORDER — AMPHETAMINE-DEXTROAMPHET ER 10 MG PO CP24: 10.0000 mg | ORAL_CAPSULE | ORAL | 0 refills | Status: DC

## 2019-07-08 ENCOUNTER — Other Ambulatory Visit: Payer: Self-pay | Admitting: Internal Medicine

## 2019-07-08 MED ORDER — AMPHETAMINE-DEXTROAMPHET ER 10 MG PO CP24: 10.0000 mg | ORAL_CAPSULE | ORAL | 0 refills | Status: DC

## 2019-07-08 MED ORDER — AMPHETAMINE-DEXTROAMPHETAMINE 10 MG PO TABS: 10.0000 mg | ORAL_TABLET | Freq: Every day | ORAL | 0 refills | Status: DC

## 2019-07-08 NOTE — Addendum Note (Signed)
Addended by: Roena Malady on: 07/08/2019 04:07 PM   Modules accepted: Orders

## 2019-07-08 NOTE — Telephone Encounter (Signed)
Last filled 05/26/2019... please advise 

## 2019-07-08 NOTE — Telephone Encounter (Signed)
Please resend brand name Adderall xr as name brand is preferred   Not sent to pharmacy to d/c generic Rx and replace with brand name per insurnce preference   Amphetamine-Dextroamphet ER 10MG  er capsules Form Blue Form (CB) Original Claim Info  75 BRAND ADDERALL XR PREFERRED - GENERIC REQUIRES [place]

## 2019-07-09 MED ORDER — ADDERALL XR 10 MG PO CP24: 10.0000 mg | ORAL_CAPSULE | ORAL | 0 refills | Status: DC

## 2019-08-10 ENCOUNTER — Telehealth: Payer: Self-pay

## 2019-08-10 NOTE — Telephone Encounter (Signed)
Mrs Fesler left v/m requesting cb from [facility] CMA about a personal matter. I spoke with pt's mom; pt's mom said pt, Dvonte is 43 and pt is taking adderall, Yurem does not have a job and is not at school. Pt sleeps all day due to staying up all night gaming. Pt's mom is going crazy with her son. Pt's mom is not sure when her son has an appt but pts mom does not think pt needs the adderall since he is not in school and not working. I advised I would transfer this info to Kastiel's chart and pt said no because he would not like that and pt's mom does not want her son to know that she has called. On Harlan's chart per DPR he gives NO ONE access to his medical records. I advised Mrs Krolak I can give her no info but I can take whatever info she wants to give me. Pt's mom said that Shawna Orleans CMA is familiar with this situation and would like cb from [facility] even if late today. FYI to [facility] CMA. tranferring this note to [facility] chart per Pamala Hurry NP.

## 2019-08-27 ENCOUNTER — Other Ambulatory Visit: Payer: Self-pay | Admitting: Internal Medicine

## 2019-08-27 NOTE — Telephone Encounter (Signed)
Last filled 07/09/2019... please advise

## 2019-08-30 MED ORDER — AMPHETAMINE-DEXTROAMPHETAMINE 10 MG PO TABS: 10.0000 mg | ORAL_TABLET | Freq: Every day | ORAL | 0 refills | Status: DC

## 2019-08-30 MED ORDER — ADDERALL XR 10 MG PO CP24: 10.0000 mg | ORAL_CAPSULE | ORAL | 0 refills | Status: DC

## 2019-10-04 ENCOUNTER — Other Ambulatory Visit: Payer: Self-pay | Admitting: Internal Medicine

## 2019-10-04 MED ORDER — ADDERALL XR 10 MG PO CP24: 10.0000 mg | ORAL_CAPSULE | ORAL | 0 refills | Status: DC

## 2019-10-04 MED ORDER — AMPHETAMINE-DEXTROAMPHETAMINE 10 MG PO TABS: 10.0000 mg | ORAL_TABLET | Freq: Every day | ORAL | 0 refills | Status: DC

## 2019-10-04 NOTE — Telephone Encounter (Signed)
Last filled 08/30/2019... please advise  °

## 2019-12-16 ENCOUNTER — Encounter: Payer: Self-pay | Admitting: Internal Medicine
